# Patient Record
Sex: Female | Born: 2000 | Race: White | Hispanic: No | Marital: Single | Smoking: Never smoker
Health system: Southern US, Community
[De-identification: ages and names within clinical notes are randomized; demographics above are authoritative.]

## PROBLEM LIST (undated history)

## (undated) DIAGNOSIS — L509 Urticaria, unspecified: Secondary | ICD-10-CM

## (undated) DIAGNOSIS — L309 Dermatitis, unspecified: Secondary | ICD-10-CM

## (undated) HISTORY — DX: Urticaria, unspecified: L50.9

## (undated) HISTORY — DX: Dermatitis, unspecified: L30.9

---
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
## 2019-04-29 DIAGNOSIS — F411 Generalized anxiety disorder: Secondary | ICD-10-CM | POA: Diagnosis not present

## 2019-06-01 DIAGNOSIS — Z0000 Encounter for general adult medical examination without abnormal findings: Secondary | ICD-10-CM | POA: Diagnosis not present

## 2019-06-01 DIAGNOSIS — Z7189 Other specified counseling: Secondary | ICD-10-CM | POA: Diagnosis not present

## 2019-06-01 DIAGNOSIS — Z23 Encounter for immunization: Secondary | ICD-10-CM | POA: Diagnosis not present

## 2019-06-01 DIAGNOSIS — Z68.41 Body mass index (BMI) pediatric, 85th percentile to less than 95th percentile for age: Secondary | ICD-10-CM | POA: Diagnosis not present

## 2019-06-01 DIAGNOSIS — Z713 Dietary counseling and surveillance: Secondary | ICD-10-CM | POA: Diagnosis not present

## 2019-06-04 DIAGNOSIS — H6692 Otitis media, unspecified, left ear: Secondary | ICD-10-CM | POA: Diagnosis not present

## 2019-09-16 DIAGNOSIS — Z6828 Body mass index (BMI) 28.0-28.9, adult: Secondary | ICD-10-CM | POA: Diagnosis not present

## 2019-09-16 DIAGNOSIS — Z01419 Encounter for gynecological examination (general) (routine) without abnormal findings: Secondary | ICD-10-CM | POA: Diagnosis not present

## 2019-09-16 DIAGNOSIS — Z3040 Encounter for surveillance of contraceptives, unspecified: Secondary | ICD-10-CM | POA: Diagnosis not present

## 2021-09-16 ENCOUNTER — Encounter (HOSPITAL_BASED_OUTPATIENT_CLINIC_OR_DEPARTMENT_OTHER): Payer: Self-pay | Admitting: *Deleted

## 2021-09-16 ENCOUNTER — Emergency Department (HOSPITAL_BASED_OUTPATIENT_CLINIC_OR_DEPARTMENT_OTHER)
Admission: EM | Admit: 2021-09-16 | Discharge: 2021-09-16 | Disposition: A | Discharge status: Home / Self Care | Payer: No Typology Code available for payment source | Attending: Emergency Medicine | Admitting: Emergency Medicine

## 2021-09-16 ENCOUNTER — Other Ambulatory Visit: Payer: Self-pay

## 2021-09-16 DIAGNOSIS — L509 Urticaria, unspecified: Secondary | ICD-10-CM

## 2021-09-16 MED ORDER — DEXAMETHASONE SODIUM PHOSPHATE 10 MG/ML IJ SOLN
10.0000 mg | Freq: Once | INTRAMUSCULAR | Status: AC
  Administered 2021-09-16: 10 mg via INTRAMUSCULAR
  Filled 2021-09-16: qty 1

## 2021-09-16 MED ORDER — EPINEPHRINE 0.3 MG/0.3ML IJ SOAJ: 0.3000 mg | INTRAMUSCULAR | 0 refills | Status: DC | PRN

## 2021-09-16 MED ORDER — FAMOTIDINE 20 MG PO TABS
20.0000 mg | ORAL_TABLET | Freq: Once | ORAL | Status: AC
  Administered 2021-09-16: 20 mg via ORAL
  Filled 2021-09-16: qty 1

## 2021-09-16 MED ORDER — PREDNISONE 20 MG PO TABS: ORAL_TABLET | ORAL | 0 refills | Status: AC

## 2021-09-16 NOTE — Discharge Instructions (Signed)
Take Benadryl 25 mg every 6 hours for the next 2 days.  It can be helpful if you take Pepcid twice a day with this.  Fill the prescription for prednisone and take it as prescribed.  Call your allergist Monday morning to schedule follow-up this week.  If you have any worsening symptoms including tongue swelling, throat swelling, difficulty swallowing, shortness of breath, abdominal pain with vomiting, administer the EpiPen and call 911.

## 2021-09-16 NOTE — ED Provider Notes (Signed)
[facility] EMERGENCY DEPARTMENT Provider Note   CSN: [id number] Arrival date & time: 09/16/21  0018     History Chief Complaint  Patient presents with   Allergic Reaction    Brianna Wade is a 20 y.o. female.  Patient presents to the emergency department for evaluation of hives.  Patient reports that she broke out in hives 3 days ago after eating soup.  She thought she had an allergic reaction to the soup, does not have any history of food allergies.  She has had URI symptoms for several days prior to that.  She has been taking Zyrtec for this.  This is a new medication for her.  This is the only new medication she has been on.  Patient reports that she has broken out in hives 3 different times.  Tonight she had hives all over her body.  Her hands and feet were swelling.  She felt like there was a lump in her throat and she had some nausea.  She took 50 mg of Benadryl prior to coming to the ER.  Rash is improving.      History reviewed. No pertinent past medical history.  There are no problems to display for this patient.   History reviewed. No pertinent surgical history.   OB History   No obstetric history on file.     No family history on file.  Social History   Tobacco Use   Smoking status: Never   Smokeless tobacco: Never  Vaping Use   Vaping Use: Never used  Substance Use Topics   Alcohol use: Yes   Drug use: Never    Home Medications Prior to Admission medications   Medication Sig Start Date End Date Taking? Authorizing Provider  EPINEPHrine 0.3 mg/0.3 mL IJ SOAJ injection Inject 0.3 mg into the muscle as needed for anaphylaxis. 09/16/21  Yes Kalinda Romaniello, Canary Brim, MD  predniSONE (DELTASONE) 20 MG tablet 3 tabs po daily x 3 days, then 2 tabs x 3 days, then 1.5 tabs x 3 days, then 1 tab x 3 days, then 0.5 tabs x 3 days 09/16/21  Yes Mekenna Finau, Canary Brim, MD    Allergies    Strawberry (diagnostic)  Review of Systems   Review of  Systems  Skin:  Positive for rash.  All other systems reviewed and are negative.  Physical Exam Updated Vital Signs BP 119/61 (BP Location: Left Arm)   Pulse 78   Temp 98.4 F (36.9 C) (Oral)   Resp 18   Ht 5\' 1"  (1.549 m)   Wt 61.2 kg   LMP 08/27/2021 (Exact Date)   SpO2 100%   BMI 25.51 kg/m   Physical Exam Vitals and nursing note reviewed.  Constitutional:      General: She is not in acute distress.    Appearance: Normal appearance. She is well-developed.  HENT:     Head: Normocephalic and atraumatic.     Right Ear: Hearing normal.     Left Ear: Hearing normal.     Nose: Nose normal.  Eyes:     Conjunctiva/sclera: Conjunctivae normal.     Pupils: Pupils are equal, round, and reactive to light.  Cardiovascular:     Rate and Rhythm: Regular rhythm.     Heart sounds: S1 normal and S2 normal. No murmur heard.   No friction rub. No gallop.  Pulmonary:     Effort: Pulmonary effort is normal. No respiratory distress.     Breath sounds: Normal breath sounds.  Chest:     Chest wall: No tenderness.  Abdominal:     General: Bowel sounds are normal.     Palpations: Abdomen is soft.     Tenderness: There is no abdominal tenderness. There is no guarding or rebound. Negative signs include Murphy's sign and McBurney's sign.     Hernia: No hernia is present.  Musculoskeletal:        General: Normal range of motion.     Cervical back: Normal range of motion and neck supple.  Skin:    General: Skin is warm and dry.     Findings: No rash.  Neurological:     Mental Status: She is alert and oriented to person, place, and time.     GCS: GCS eye subscore is 4. GCS verbal subscore is 5. GCS motor subscore is 6.     Cranial Nerves: No cranial nerve deficit.     Sensory: No sensory deficit.     Coordination: Coordination normal.  Psychiatric:        Speech: Speech normal.        Behavior: Behavior normal.        Thought Content: Thought content normal.    ED Results /  Procedures / Treatments   Labs (all labs ordered are listed, but only abnormal results are displayed) Labs Reviewed - No data to display  EKG None  Radiology No results found.  Procedures Procedures   Medications Ordered in ED Medications  famotidine (PEPCID) tablet 20 mg (has no administration in time range)  dexamethasone (DECADRON) injection 10 mg (has no administration in time range)    ED Course  I have reviewed the triage vital signs and the nursing notes.  Pertinent labs & imaging results that were available during my care of the patient were reviewed by me and considered in my medical decision making (see chart for details).    MDM Rules/Calculators/A&P                           Patient has had intermittent episodes of hives over the last several days.  Trigger is unclear.  It started after eating a new type of soup.  She has not had any further exposures to the soup but has had recurrent episodes of hives.  She also was taking a generic brand of Zyrtec for cold symptoms.  We will stop this medication.  It is unlikely the cause of the hives but cannot rule this out.  Patient took Benadryl prior to coming to the ER and hives are improving, resolved.  We will continue scheduled dose of Benadryl and Pepcid.  Given Decadron here in the department and will give tapering dose of prednisone.  Patient does have an allergist, will call Monday morning for follow-up.  As the trigger is unknown, will also prescribe EpiPen.  Given instructions to use EpiPen for any severe allergic reaction including tongue swelling, throat swelling, difficulty breathing.  She will call 911 if this occurs.  Final Clinical Impression(s) / ED Diagnoses Final diagnoses:  Hives    Rx / DC Orders ED Discharge Orders          Ordered    predniSONE (DELTASONE) 20 MG tablet        09/16/21 0107    EPINEPHrine 0.3 mg/0.3 mL IJ SOAJ injection  As needed        09/16/21 0107  Gilda Crease, MD 09/16/21 [phone]

## 2021-09-16 NOTE — ED Triage Notes (Signed)
Allergic reaction x 3 days. Reports full body hives. Took 2 benadryl pta

## 2024-04-13 ENCOUNTER — Other Ambulatory Visit: Payer: Self-pay

## 2024-04-13 ENCOUNTER — Emergency Department (HOSPITAL_BASED_OUTPATIENT_CLINIC_OR_DEPARTMENT_OTHER)
Admission: EM | Admit: 2024-04-13 | Discharge: 2024-04-13 | Disposition: A | Discharge status: Home / Self Care | Attending: Emergency Medicine | Admitting: Emergency Medicine

## 2024-04-13 ENCOUNTER — Emergency Department (HOSPITAL_BASED_OUTPATIENT_CLINIC_OR_DEPARTMENT_OTHER)

## 2024-04-13 ENCOUNTER — Encounter (HOSPITAL_BASED_OUTPATIENT_CLINIC_OR_DEPARTMENT_OTHER): Payer: Self-pay | Admitting: Emergency Medicine

## 2024-04-13 DIAGNOSIS — R42 Dizziness and giddiness: Secondary | ICD-10-CM | POA: Diagnosis present

## 2024-04-13 DIAGNOSIS — R21 Rash and other nonspecific skin eruption: Secondary | ICD-10-CM | POA: Diagnosis not present

## 2024-04-13 DIAGNOSIS — H538 Other visual disturbances: Secondary | ICD-10-CM

## 2024-04-13 LAB — URINALYSIS, ROUTINE W REFLEX MICROSCOPIC
Bilirubin Urine: NEGATIVE
Glucose, UA: NEGATIVE mg/dL
Hgb urine dipstick: NEGATIVE
Ketones, ur: NEGATIVE mg/dL
Leukocytes,Ua: NEGATIVE
Nitrite: NEGATIVE
Protein, ur: NEGATIVE mg/dL
Specific Gravity, Urine: 1.01 (ref 1.005–1.030)
pH: 6.5 (ref 5.0–8.0)

## 2024-04-13 LAB — CBG MONITORING, ED: Glucose-Capillary: 99 mg/dL (ref 70–99)

## 2024-04-13 LAB — CBC
HCT: 43.8 % (ref 36.0–46.0)
Hemoglobin: 15.4 g/dL — ABNORMAL HIGH (ref 12.0–15.0)
MCH: 31 pg (ref 26.0–34.0)
MCHC: 35.2 g/dL (ref 30.0–36.0)
MCV: 88.3 fL (ref 80.0–100.0)
Platelets: 268 10*3/uL (ref 150–400)
RBC: 4.96 MIL/uL (ref 3.87–5.11)
RDW: 13.1 % (ref 11.5–15.5)
WBC: 13.9 10*3/uL — ABNORMAL HIGH (ref 4.0–10.5)
nRBC: 0 % (ref 0.0–0.2)

## 2024-04-13 LAB — COMPREHENSIVE METABOLIC PANEL WITH GFR
ALT: 9 U/L (ref 0–44)
AST: 11 U/L — ABNORMAL LOW (ref 15–41)
Albumin: 4.6 g/dL (ref 3.5–5.0)
Alkaline Phosphatase: 71 U/L (ref 38–126)
Anion gap: 12 (ref 5–15)
BUN: 12 mg/dL (ref 6–20)
CO2: 29 mmol/L (ref 22–32)
Calcium: 10.1 mg/dL (ref 8.9–10.3)
Chloride: 98 mmol/L (ref 98–111)
Creatinine, Ser: 0.8 mg/dL (ref 0.44–1.00)
GFR, Estimated: >60 mL/min (ref >60)
Glucose, Bld: 101 mg/dL — ABNORMAL HIGH (ref 70–99)
Potassium: 3.9 mmol/L (ref 3.5–5.1)
Sodium: 139 mmol/L (ref 135–145)
Total Bilirubin: 0.6 mg/dL (ref 0.0–1.2)
Total Protein: 7.6 g/dL (ref 6.5–8.1)

## 2024-04-13 LAB — PREGNANCY, URINE: Preg Test, Ur: NEGATIVE

## 2024-04-13 NOTE — Discharge Instructions (Signed)
 Continue prednisone  taper.  Discontinue Benadryl Allegra meclizine.  Follow-up with your primary care doctor.  Return if symptoms worsen.

## 2024-04-13 NOTE — ED Triage Notes (Signed)
 Pt presents with mom who states that she started having intermittent hives on May 14th. Hives resolved but then returned 5/18 and spread throughout her body on 5/19. Seen and received rx for prednisone  and has been taking benadryl and allegra. On 5/22 her PCP increased prednisone  to 60mg  with gradual taper. Pt has decreased appetite, GI upset, dizziness. She went to hospital 5/25 and was given rx for nausea that has not helped. Per mom, pt was "incoherent" after work this afternoon which is why they have returned to the ER. Pt reports intermittent headache and numbness in her hands as well as blurred vision to right eye.

## 2024-04-13 NOTE — ED Notes (Signed)
 Pt states that her itching is improved but she wanted to be evaluated for dizziness (without focal weakness or neuro deficit) and nausea.  Alert and oriented at this time.

## 2024-04-13 NOTE — ED Provider Notes (Signed)
 [place] EMERGENCY DEPARTMENT AT [facility] Provider Note   CSN: [id number] Arrival date & time: 04/13/24  1855     History  Chief Complaint  Patient presents with   Urticaria   Headache   Blurred Vision    Brianna Wade is a 22 y.o. female.  Patient here with ongoing dizziness blurred vision rash.  She is currently on prednisone  taper Allegra meclizine.  She has discontinued Benadryl.  She developed a rash a couple days ago got better on steroids and the rash came back and now she is on a prolonged prednisone  taper.  She was taking Benadryl and now has switched over to Allegra.  She started having some dizzy symptoms and has been started on meclizine.  Sounds like she was pretty sedated this morning.  Having blurred vision at times dizziness episodes but mostly resolved now.  She denies any fevers or chills.  She has been following with her primary care doctor.  She is supposed to see an allergist.  Considering doing more lab work when she is off prednisone .  Rash is mostly resolved but she will notice in the morning that they will be some hives and swelling sometimes.  She will notice some swelling in the joints in her hands.  She denies any recent surgery or travel.  No camping.  No fever.  No headache neck pain or vision loss.  No autoimmune diseases in the family.  No history of MS.  No headaches.  The history is provided by the patient.       Home Medications Prior to Admission medications   Medication Sig Start Date End Date Taking? Authorizing Provider  EPINEPHrine  0.3 mg/0.3 mL IJ SOAJ injection Inject 0.3 mg into the muscle as needed for anaphylaxis. 09/16/21   Ballard Bongo, MD  predniSONE  (DELTASONE ) 20 MG tablet 3 tabs po daily x 3 days, then 2 tabs x 3 days, then 1.5 tabs x 3 days, then 1 tab x 3 days, then 0.5 tabs x 3 days 09/16/21   Ballard Bongo, MD      Allergies    Strawberry (diagnostic)    Review of Systems   Review of  Systems  Physical Exam Updated Vital Signs BP 123/84   Pulse 69   Temp 98.1 F (36.7 C) (Oral)   Resp 15   Ht 5\' 1"  (1.549 m)   Wt 63 kg   LMP 04/06/2024 (Exact Date)   SpO2 99%   BMI 26.26 kg/m  Physical Exam Vitals and nursing note reviewed.  Constitutional:      General: She is not in acute distress.    Appearance: She is well-developed. She is not ill-appearing.  HENT:     Head: Normocephalic and atraumatic.     Mouth/Throat:     Mouth: Mucous membranes are moist.  Eyes:     General: No visual field deficit.    Extraocular Movements: Extraocular movements intact.     Right eye: Normal extraocular motion and no nystagmus.     Left eye: Normal extraocular motion and no nystagmus.     Conjunctiva/sclera: Conjunctivae normal.     Pupils: Pupils are equal, round, and reactive to light.     Right eye: Pupil is reactive.     Left eye: Pupil is reactive.  Neck:     Meningeal: Brudzinski's sign and Kernig's sign absent.  Cardiovascular:     Rate and Rhythm: Normal rate and regular rhythm.     Heart sounds:  Normal heart sounds. No murmur heard. Pulmonary:     Effort: Pulmonary effort is normal. No respiratory distress.     Breath sounds: Normal breath sounds.  Abdominal:     Palpations: Abdomen is soft.     Tenderness: There is no abdominal tenderness.  Musculoskeletal:        General: No swelling.     Cervical back: Normal range of motion and neck supple. No rigidity.  Lymphadenopathy:     Cervical: No cervical adenopathy.  Skin:    General: Skin is warm and dry.     Capillary Refill: Capillary refill takes less than 2 seconds.     Findings: No rash.  Neurological:     Mental Status: She is alert and oriented to person, place, and time.     Cranial Nerves: No cranial nerve deficit, dysarthria or facial asymmetry.     Sensory: No sensory deficit.     Motor: No weakness.     Coordination: Coordination normal.     Comments: 5+ out of 5 strength throughout, normal  sensation, no drift, normal speech, normal visual fields, 20/20 vision bilaterally, normal gait  Psychiatric:        Mood and Affect: Mood normal.     ED Results / Procedures / Treatments   Labs (all labs ordered are listed, but only abnormal results are displayed) Labs Reviewed  COMPREHENSIVE METABOLIC PANEL WITH GFR - Abnormal; Notable for the following components:      Result Value   Glucose, Bld 101 (*)    AST 11 (*)    All other components within normal limits  CBC - Abnormal; Notable for the following components:   WBC 13.9 (*)    Hemoglobin 15.4 (*)    All other components within normal limits  URINALYSIS, ROUTINE W REFLEX MICROSCOPIC  PREGNANCY, URINE  CBG MONITORING, ED    EKG None  Radiology CT Head Wo Contrast Result Date: 04/13/2024 CLINICAL DATA:  Altered mental status with intermittent headache, hand numbness and right eye blurred vision. EXAM: CT HEAD WITHOUT CONTRAST TECHNIQUE: Contiguous axial images were obtained from the base of the skull through the vertex without intravenous contrast. RADIATION DOSE REDUCTION: This exam was performed according to the departmental dose-optimization program which includes automated exposure control, adjustment of the mA and/or kV according to patient size and/or use of iterative reconstruction technique. COMPARISON:  None Available. FINDINGS: Brain: No evidence of acute infarction, hemorrhage, hydrocephalus, extra-axial collection or mass lesion/mass effect. Vascular: No hyperdense vessel or unexpected calcification. Skull: Normal. Negative for fracture or focal lesion. Sinuses/Orbits: There is mild sphenoid sinus mucosal thickening. Other: None. IMPRESSION: 1. No acute intracranial abnormality. 2. Mild sphenoid sinus disease. Electronically Signed   By: Virgle Grime M.D.   On: 04/13/2024 20:08    Procedures Procedures    Medications Ordered in ED Medications - No data to display  ED Course/ Medical Decision Making/  A&P                                 Medical Decision Making Amount and/or Complexity of Data Reviewed Labs: ordered. Radiology: ordered.   Brianna Wade is here with dizziness blurred vision.  She has been dealing with hives for several weeks now on a prolonged steroid taper.  She been taking Benadryl and Allegra but now mostly taken Allegra.  She has also been taking meclizine.  She was confused this morning after work.  She has been having some intermittent headaches and numbness but not having any other symptoms now.  She is neurologically intact.  She has a normal gait.  Normal visual fields.  Normal visual acuity.  There is no rash on exam.  I think some of her symptoms could be secondary to medication side effect.  I recommended that she discontinue Benadryl Allegra and meclizine.  I would recommend that she finish her steroid taper.  I got a head CT basic labs to evaluate for any electrolyte abnormality head mass head bleed.  CT of the head was unremarkable.  Labs were unremarkable.  I reviewed interpreted labs and imaging.  She had no significant leukocytosis anemia or electrolyte abnormality.  Her vital signs are normal.  She has no fever.  Have no concern for meningitis.  She is having some intermittent symptoms.  She is having some joint swelling at times and joint pain at times.  I do think this could be an autoimmune or rheumatological process.  This could be a stress related process.  Could be a new allergic process.  She might still be getting some exposure in her environment.  This could be medication side effect.  Ultimately I do not think there is an emergent process going on.  Have no concern for stroke MS or other acute neurologic process.  She has reassuring exam.  I will have her follow-up with her primary care doctor tomorrow which is in place to further discuss prednisone .  I think she will need further workup if symptoms recur especially the rash.  She has been referred to  allergy.  She might need rheumatological workup.  However at this time I think she stable for discharge.  Understands return precautions.  This chart was dictated using voice recognition software.  Despite best efforts to proofread,  errors can occur which can change the documentation meaning.         Final Clinical Impression(s) / ED Diagnoses Final diagnoses:  Dizziness  Blurred vision  Rash    Rx / DC Orders ED Discharge Orders     None         Lowery Rue, DO 04/13/24 2259

## 2024-05-13 ENCOUNTER — Ambulatory Visit: Payer: Self-pay | Admitting: Allergy & Immunology

## 2024-05-13 ENCOUNTER — Encounter: Payer: Self-pay | Admitting: Allergy & Immunology

## 2024-05-13 ENCOUNTER — Encounter (HOSPITAL_BASED_OUTPATIENT_CLINIC_OR_DEPARTMENT_OTHER): Payer: Self-pay | Admitting: Emergency Medicine

## 2024-05-13 ENCOUNTER — Other Ambulatory Visit: Payer: Self-pay

## 2024-05-13 VITALS — BP 98/70 | HR 87 | Temp 98.3°F | Resp 18 | Ht 61.81 in | Wt 145.9 lb

## 2024-05-13 DIAGNOSIS — J310 Chronic rhinitis: Secondary | ICD-10-CM

## 2024-05-13 DIAGNOSIS — L508 Other urticaria: Secondary | ICD-10-CM | POA: Diagnosis not present

## 2024-05-13 NOTE — Patient Instructions (Addendum)
 1. Chronic urticaria - We are going to get some labs to look for other weird causes of swelling and hives (although you have had a large workup already) - Alpha-Gal Panel - Hymenoptera Venom Allergy Prof - Deveron, White Face IgE - Hornet, Yellow IgE - C1 esterase inhibitor, functional - C1 Esterase Inhibitor - C3 and C4  2. Chronic rhinitis - We will schedule the skin testing for 6 months and we can cancel if this seems to be a Rheumatology issue.  3. Return in about 6 months (around 11/12/2024) for SKIN TESTING. You can have the follow up appointment with Dr. Iva or a Nurse Practicioner (our Nurse Practitioners are excellent and always have Physician oversight!).    Please inform us  of any Emergency Department visits, hospitalizations, or changes in symptoms. Call us  before going to the ED for breathing or allergy symptoms since we might be able to fit you in for a sick visit. Feel free to contact us  anytime with any questions, problems, or concerns.  It was a pleasure to meet you today!  Websites that have reliable patient information: 1. American Academy of Asthma, Allergy, and Immunology: www.aaaai.org 2. Food Allergy Research and Education (FARE): foodallergy.org 3. Mothers of Asthmatics: http://www.asthmacommunitynetwork.org 4. American College of Allergy, Asthma, and Immunology: www.acaai.org      "Like" us  on Facebook and Instagram for our latest updates!      A healthy democracy works best when [employer] participate! Make sure you are registered to vote! If you have moved or changed any of your contact information, you will need to get this updated before voting! Scan the QR codes below to learn more!

## 2024-05-13 NOTE — Progress Notes (Signed)
 NEW PATIENT  Date of Service/Encounter:  05/13/24  Consult requested by: No primary care provider on file.   Assessment:   Chronic urticaria - checking some other syndromes associated with urticaria and angioedema  Elevated ANA (1:160) - with Rheumatology referral already made  Chronic rhinitis - seasonally in the spring, deferring on skin testing until she seems Rheumatology  Plan/Recommendations:   1. Chronic urticaria - We are going to get some labs to look for other weird causes of swelling and hives (although you have had a large workup already) - Alpha-Gal Panel - Hymenoptera Venom Allergy Prof - Deveron, White Face IgE - Hornet, Yellow IgE - C1 esterase inhibitor, functional - C1 Esterase Inhibitor - C3 and C4  2. Chronic rhinitis - We will schedule the skin testing for 6 months and we can cancel if this seems to be a Rheumatology issue.  3. Return in about 6 months (around 11/12/2024) for SKIN TESTING. You can have the follow up appointment with Dr. Iva or a Nurse Practicioner (our Nurse Practitioners are excellent and always have Physician oversight!).   This note in its entirety was forwarded to the Provider who requested this consultation.  Subjective:   Brianna Wade is a 23 y.o. female presenting today for evaluation of  Chief Complaint  Patient presents with   Establish Care    Pt reports about a year ago she had an episode of hives and swelling. About a month ago had the same episode and went to the ED. PCP referred her to us .    Brianna Wade has a history of the following: Patient Active Problem List   Diagnosis Date Noted   Chronic urticaria 05/13/2024   Chronic rhinitis 05/13/2024    History obtained from: chart review and patient.  Discussed the use of AI scribe software for clinical note transcription with the patient and/or guardian, who gave verbal consent to proceed.  Brianna Wade was referred by No primary care provider on  file.Brianna Wade is a 23 y.o. female presenting for an evaluation of urticaria.  She initially experienced severe hives about a year and a half ago, requiring an emergency room visit. The hives were managed with steroids and penicillin, resolving after some time. No further episodes occurred until last month.  In the past month, she has experienced recurrent hives and swelling of her hands, feet, and eyes, leading to three emergency room visits. During one visit, she had a severe reaction to prednisone , resulting in nausea, vomiting, and diarrhea, causing a weight loss of ten pounds over a few days. She was subsequently switched to methylprednisolone.  The hives primarily affect her arms, legs, hands, and feet, sparing her stomach and back. The episodes last about two weeks despite treatment. No fever is reported during these episodes. Extensive blood work, including an ANA test, returned positive with a speckled pattern at a titer of 1:160. However, there is no family history of autoimmune disorders.  She has a history of environmental allergies, experiencing symptoms such as sneezing, itchy and watery eyes, for which she takes Claritin, uses a nasal spray, and eye drops. She denies any recent dietary changes, tick bites, or insect stings that could have triggered the hives.  Her social history includes her occupation as a Runner, broadcasting/film/video at [employer]. She has a degree in special education and elementary education. She follows a diet that excludes red meat, occasionally consuming chicken and eggs, and enjoys cheese. She has two sisters and one  brother, with her older sister having severe food allergies.     Otherwise, there is no history of other atopic diseases, including asthma, food allergies, drug allergies, stinging insect allergies, or contact dermatitis. There is no significant infectious history. Vaccinations are up to date.    Past Medical History: Patient Active Problem List    Diagnosis Date Noted   Chronic urticaria 05/13/2024   Chronic rhinitis 05/13/2024    Medication List:  Allergies as of 05/13/2024       Reactions   Allegra [fexofenadine] Other (See Comments)   Brain fog, confusion, incoherent    Prednisone  Diarrhea, Nausea And Vomiting        Medication List        Accurate as of May 13, 2024  1:22 PM. If you have any questions, ask your nurse or doctor.          ALLERGY EYE DROPS OP Apply 1 each to eye daily.   fluticasone 50 MCG/ACT nasal spray Commonly known as: FLONASE Place 1 spray into both nostrils daily.   levonorgestrel-ethinyl estradiol 0.15-30 MG-MCG tablet Commonly known as: NORDETTE Take 1 tablet by mouth daily.   loratadine 10 MG tablet Commonly known as: CLARITIN Take 10 mg by mouth daily.        Birth History: non-contributory  Developmental History: non-contributory  Past Surgical History: History reviewed. No pertinent surgical history.   Family History: Family History  Problem Relation Age of Onset   Asthma Father    Allergic rhinitis Father    Eczema Sister    Allergic rhinitis Sister    Eczema Brother    Asthma Brother    Allergic rhinitis Brother      Social History: Brianna Wade lives at home in an apartment that is 24 years old. There is carpeting throughout the home. There is electric heating and central cooling. There are dogs inside of the home. There are no dust mite coverings on the bedding. There is no tobacco or smoke exposure in the home. She is currently working as a Runner, broadcasting/film/video for [employer]. There is vape exposure in the car as well as the apartment. 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 She seems to be enjoying it. Her mother was a Runner, broadcasting/film/video and inspired her to become a Runner, broadcasting/film/video. She is not exposed to fumes, chemicals, or dust. [place] or [place].    Review of systems otherwise negative other than that mentioned in the  HPI.    Objective:   Blood pressure 98/70, pulse 87, temperature 98.3 F (36.8 C), temperature source Temporal, resp. rate 18, height 5' 1.81 (1.57 m), weight 145 lb 14.4 oz (66.2 kg), SpO2 98%. Body mass index is 26.85 kg/m.     Physical Exam Constitutional:      Appearance: She is well-developed.  HENT:     Head: Normocephalic and atraumatic.     Right Ear: Tympanic membrane, ear canal and external ear normal. No drainage, swelling or tenderness. Tympanic membrane is not injected, scarred, erythematous, retracted or bulging.     Left Ear: Tympanic membrane, ear canal and external ear normal. No drainage, swelling or tenderness. Tympanic membrane is not injected, scarred, erythematous, retracted or bulging.     Nose: No nasal deformity, septal deviation, mucosal edema or rhinorrhea.     Right Sinus: No maxillary sinus tenderness or frontal sinus tenderness.     Left Sinus: No maxillary sinus tenderness or frontal sinus tenderness.     Mouth/Throat:  Mouth: Mucous membranes are not pale and not dry.     Pharynx: Uvula midline.   Eyes:     General:        Right eye: No discharge.        Left eye: No discharge.     Conjunctiva/sclera: Conjunctivae normal.     Right eye: Right conjunctiva is not injected. No chemosis.    Left eye: Left conjunctiva is not injected. No chemosis.    Pupils: Pupils are equal, round, and reactive to light.    Cardiovascular:     Rate and Rhythm: Normal rate and regular rhythm.     Heart sounds: Normal heart sounds.  Pulmonary:     Effort: Pulmonary effort is normal. No tachypnea, accessory muscle usage or respiratory distress.     Breath sounds: Normal breath sounds. No wheezing, rhonchi or rales.  Chest:     Chest wall: No tenderness.  Abdominal:     Tenderness: There is no abdominal tenderness. There is no guarding or rebound.  Lymphadenopathy:     Head:     Right side of head: No submandibular, tonsillar or occipital adenopathy.      Left side of head: No submandibular, tonsillar or occipital adenopathy.     Cervical: No cervical adenopathy.   Skin:    General: Skin is warm.     Capillary Refill: Capillary refill takes less than 2 seconds.     Coloration: Skin is not pale.     Findings: No abrasion, erythema, petechiae or rash. Rash is not papular, urticarial or vesicular.     Comments: No hives at all. She does have a tattoo with serotonin molecule on her right wrist.    Neurological:     Mental Status: She is alert.      Diagnostic studies: labs sent instead           Marty Shaggy, MD [facility] 

## 2024-05-16 LAB — ALPHA-GAL PANEL
Allergen Lamb IgE: <0.1 kU/L
Beef IgE: <0.1 kU/L
IgE (Immunoglobulin E), Serum: 12 [IU]/mL (ref 6–495)
O215-IgE Alpha-Gal: <0.1 kU/L
Pork IgE: <0.1 kU/L

## 2024-05-16 LAB — HYMENOPTERA VENOM ALLERGY PROF

## 2024-05-18 LAB — HYMENOPTERA VENOM ALLERGY PROF
I001-IgE Honeybee: <0.1 kU/L
I208-IgE Api m 1: <0.1 kU/L
I209-IgE Ves v 5: <0.1 kU/L
I211-IgE Ves v 1: 0.17 kU/L — AB
I211-IgE Ves v 1: <0.1 kU/L
I214-IgE Api m 2: <0.1 kU/L
I215-IgE Api m 3: <0.1 kU/L
I216-IgE Api m 5: <0.1 kU/L
I217-IgE Api m 10: <0.1 kU/L
Reflex Information: <0.1 kU/L
Reflex Information: <0.1 kU/L
Tryptase: 3.1 ug/L (ref 2.2–13.2)

## 2024-05-18 LAB — I002-IGE HORNET, WHITE FACE: Hornet, White Face, IgE: <0.1 kU/L

## 2024-05-18 LAB — I005-IGE HORNET, YELLOW: Hornet, Yellow, IgE: <0.1 kU/L

## 2024-05-18 LAB — C3 AND C4
Complement C3, Serum: 139 mg/dL (ref 82–167)
Complement C4, Serum: 19 mg/dL (ref 12–38)

## 2024-05-18 LAB — C1 ESTERASE INHIBITOR: C1INH SerPl-mCnc: 34 mg/dL (ref 21–39)

## 2024-05-18 LAB — ALLERGEN COMPONENT COMMENTS

## 2024-05-18 LAB — C1 ESTERASE INHIBITOR, FUNCTIONAL: C1INH Functional/C1INH Total MFr SerPl: >105 %{normal}

## 2024-05-24 ENCOUNTER — Ambulatory Visit: Payer: Self-pay | Admitting: Allergy & Immunology

## 2024-06-04 ENCOUNTER — Other Ambulatory Visit: Payer: Self-pay | Admitting: *Deleted

## 2024-06-04 MED ORDER — EPINEPHRINE 0.3 MG/0.3ML IJ SOAJ: 0.3000 mg | INTRAMUSCULAR | 1 refills | Status: AC | PRN

## 2024-06-16 NOTE — Telephone Encounter (Signed)
 Patient left a voicemail stating she was referred to rheumatology by us , but states we were to fax some paperwork to the rheumatologist before she can make an appointment and would like someone to call her back to discuss this.

## 2024-06-17 ENCOUNTER — Telehealth: Payer: Self-pay | Admitting: Allergy & Immunology

## 2024-06-17 NOTE — Telephone Encounter (Signed)
 PT called about rhumatology referral and need for med records, discussed with Nat who stated request from that office was obtained by main [facility] records teams (but did see in Chart). I advised PT we would try to complete for her and would look for [facility] records team info, she thanekd
